# Patient Record
Sex: Female | Born: 1960 | Race: Black or African American | Hispanic: No | Marital: Single | Smoking: Never smoker
Health system: Southern US, Community
[De-identification: ages and names within clinical notes are randomized; demographics above are authoritative.]

## PROBLEM LIST (undated history)

## (undated) HISTORY — PX: ABDOMINAL HYSTERECTOMY: SHX81

---

## 2000-03-18 ENCOUNTER — Ambulatory Visit (HOSPITAL_COMMUNITY): Admission: RE | Admit: 2000-03-18 | Discharge: 2000-03-18 | Payer: Self-pay | Admitting: Obstetrics and Gynecology

## 2012-01-04 ENCOUNTER — Other Ambulatory Visit: Payer: Self-pay | Admitting: Family Medicine

## 2012-01-04 DIAGNOSIS — N6452 Nipple discharge: Secondary | ICD-10-CM

## 2012-01-10 ENCOUNTER — Ambulatory Visit
Admission: RE | Admit: 2012-01-10 | Discharge: 2012-01-10 | Disposition: A | Discharge status: Home / Self Care | Payer: BC Managed Care – PPO | Source: Ambulatory Visit | Attending: Family Medicine | Admitting: Family Medicine

## 2012-01-10 ENCOUNTER — Other Ambulatory Visit: Payer: Self-pay | Admitting: Family Medicine

## 2012-01-10 DIAGNOSIS — N6452 Nipple discharge: Secondary | ICD-10-CM

## 2013-06-13 IMAGING — MG MM DIGITAL DIAGNOSTIC UNILAT*L*
2 series · 2 of 2 positions shown · non-contrast
Comparison: 12/22/2011

CLINICAL DATA: Calcifications, left breast.  Spontaneous left
nipple discharge.

DIGITAL DIAGNOSTIC LEFT MAMMOGRAM WITHOUT CAD

[L CC]
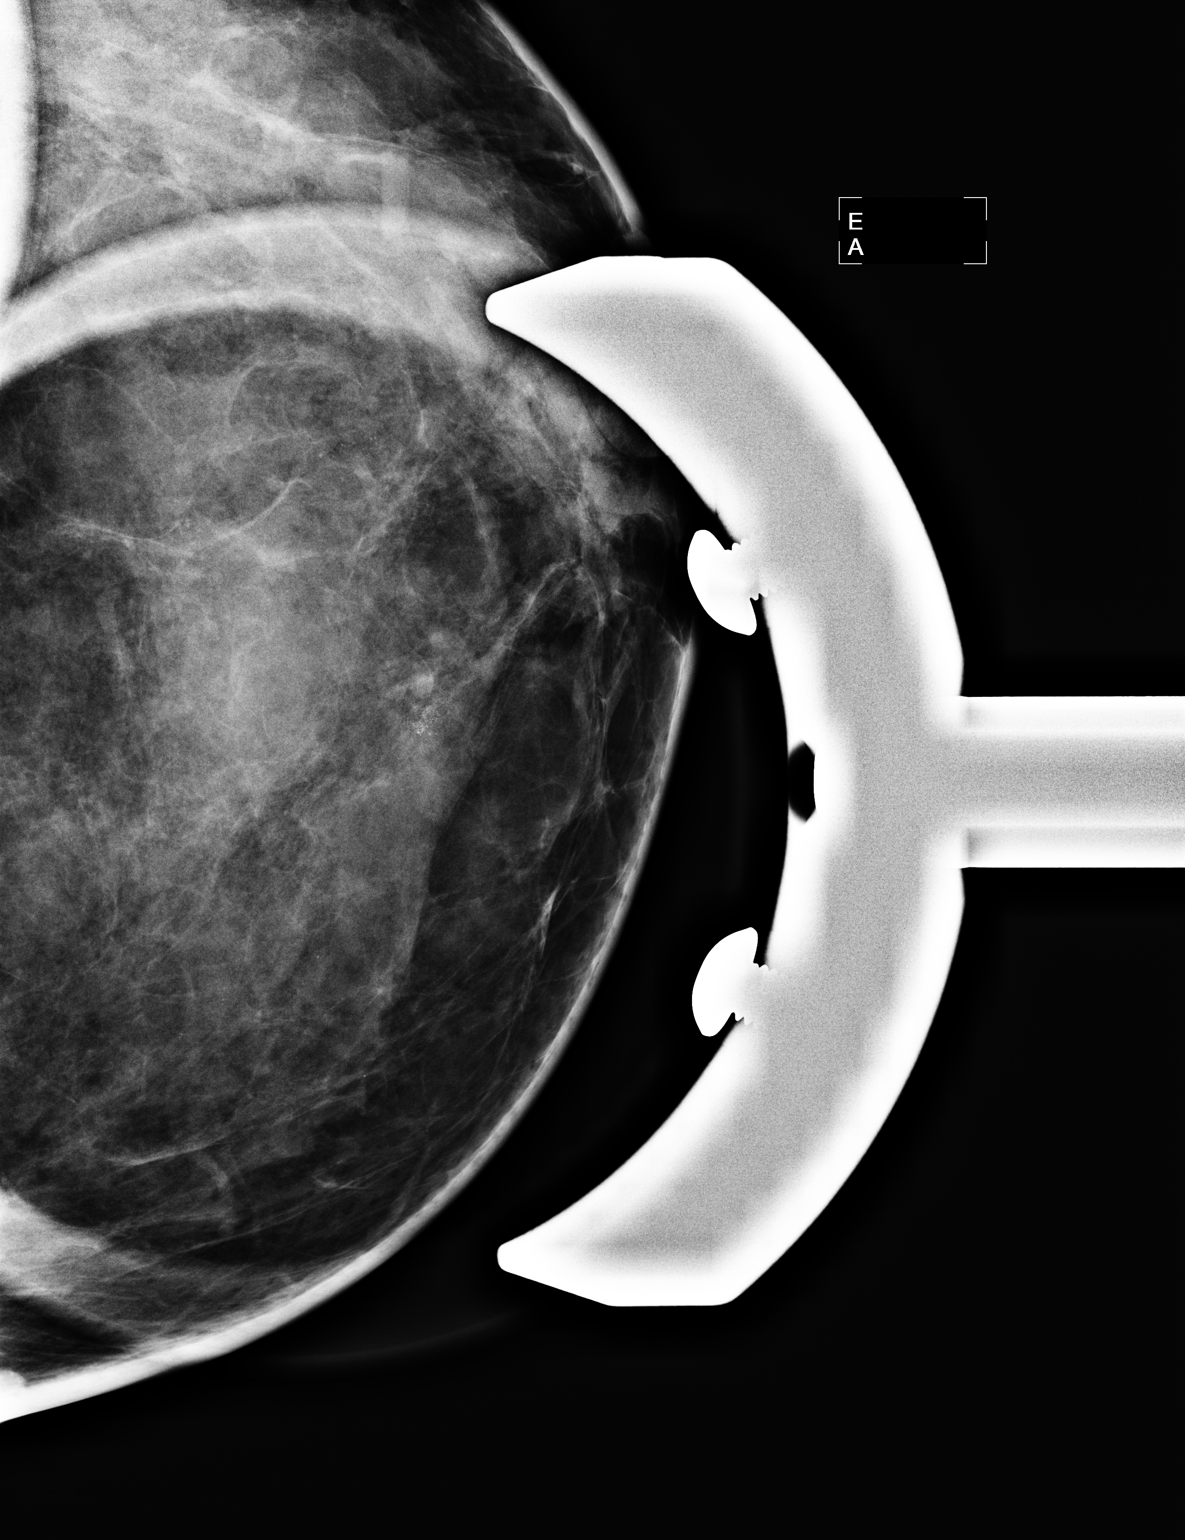

[L ML]
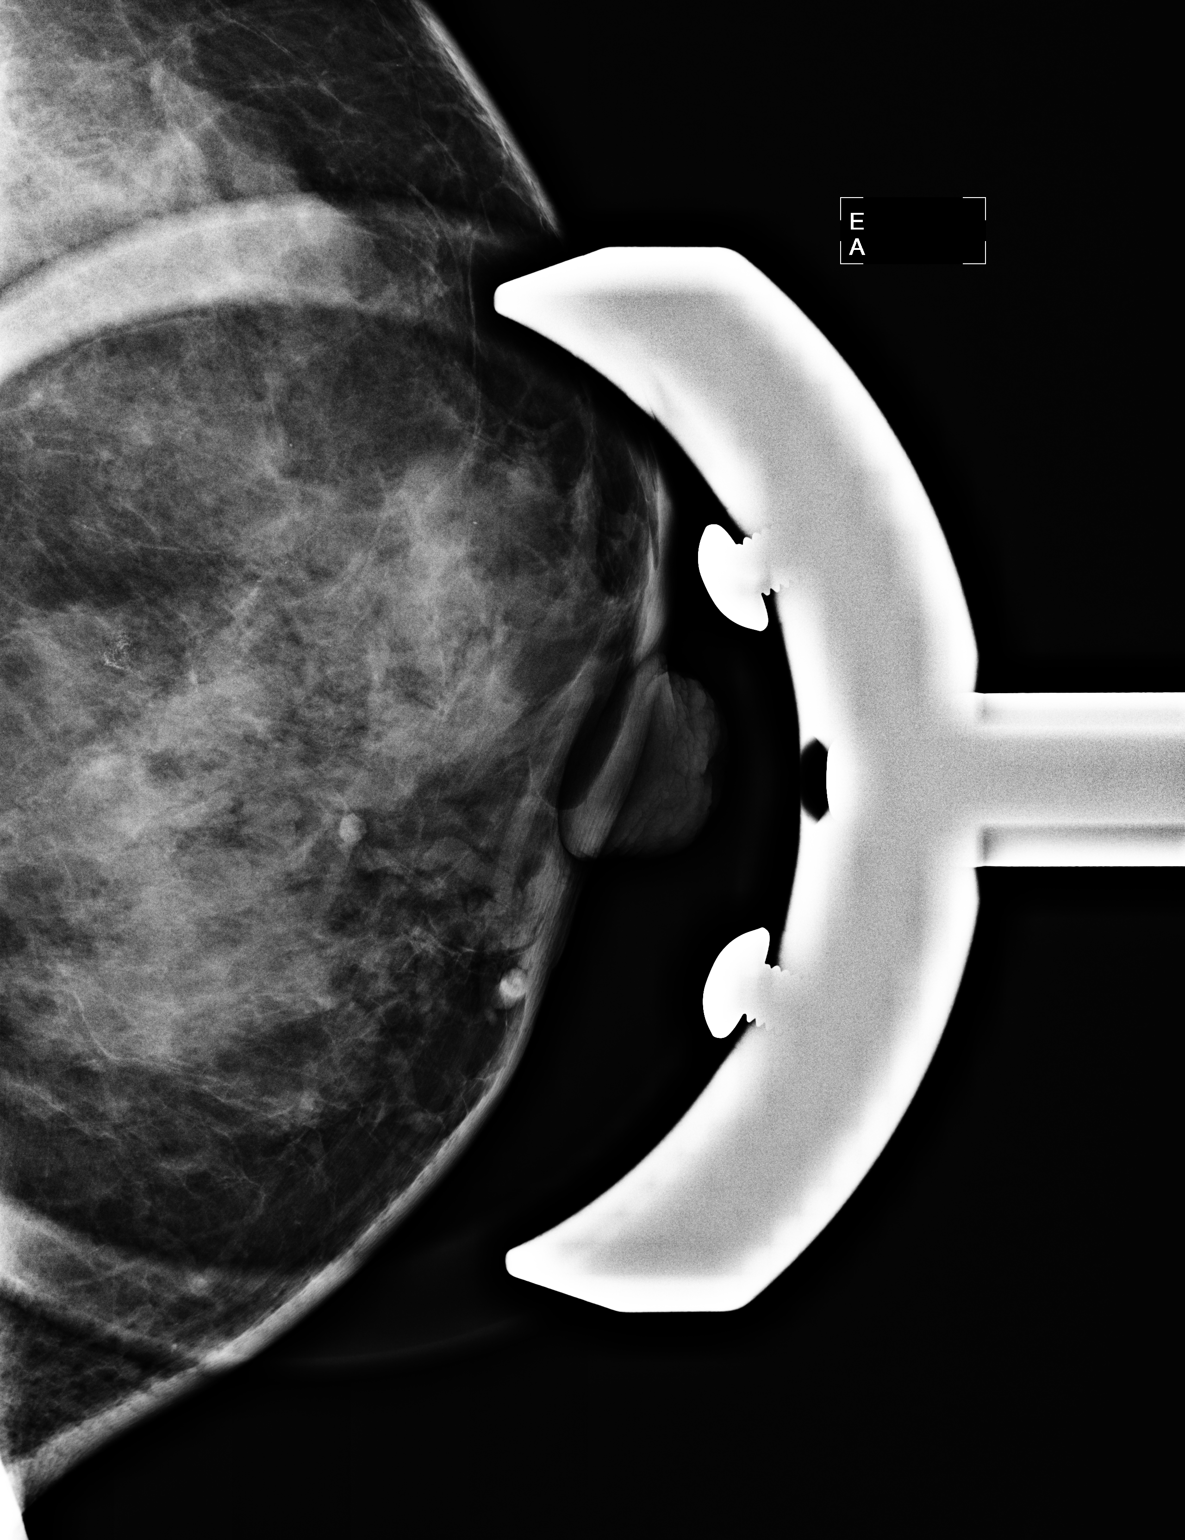

[2 of 2 positions shown; findings below may reference images not displayed]

FINDINGS: Spot magnification views confirm a cluster of
calcifications in the left subareolar region which are amorphous
without linear forms or linear distribution.

On physical exam, clear discharge is elicited from a single duct in
the 3 o'clock position of the left breast. No bloody nipple
discharge is elicited.
IMPRESSION: Probably benign calcifications, left breast. Recommend diagnostic
mammography in 6 months.

BI-RADS CATEGORY 3:  Probably benign finding(s) - short interval
follow-up suggested.

## 2015-06-14 ENCOUNTER — Emergency Department (HOSPITAL_BASED_OUTPATIENT_CLINIC_OR_DEPARTMENT_OTHER): Payer: No Typology Code available for payment source

## 2015-06-14 ENCOUNTER — Emergency Department (HOSPITAL_BASED_OUTPATIENT_CLINIC_OR_DEPARTMENT_OTHER)
Admission: EM | Admit: 2015-06-14 | Discharge: 2015-06-14 | Disposition: A | Discharge status: Home / Self Care | Payer: No Typology Code available for payment source | Attending: Emergency Medicine | Admitting: Emergency Medicine

## 2015-06-14 ENCOUNTER — Encounter (HOSPITAL_BASED_OUTPATIENT_CLINIC_OR_DEPARTMENT_OTHER): Payer: Self-pay | Admitting: Emergency Medicine

## 2015-06-14 DIAGNOSIS — S4992XA Unspecified injury of left shoulder and upper arm, initial encounter: Secondary | ICD-10-CM | POA: Insufficient documentation

## 2015-06-14 DIAGNOSIS — Y9389 Activity, other specified: Secondary | ICD-10-CM | POA: Insufficient documentation

## 2015-06-14 DIAGNOSIS — T07XXXA Unspecified multiple injuries, initial encounter: Secondary | ICD-10-CM

## 2015-06-14 DIAGNOSIS — T148 Other injury of unspecified body region: Secondary | ICD-10-CM | POA: Insufficient documentation

## 2015-06-14 DIAGNOSIS — Y92410 Unspecified street and highway as the place of occurrence of the external cause: Secondary | ICD-10-CM | POA: Insufficient documentation

## 2015-06-14 DIAGNOSIS — S3992XA Unspecified injury of lower back, initial encounter: Secondary | ICD-10-CM | POA: Diagnosis present

## 2015-06-14 DIAGNOSIS — Y998 Other external cause status: Secondary | ICD-10-CM | POA: Diagnosis not present

## 2015-06-14 MED ORDER — IBUPROFEN 800 MG PO TABS: 800.0000 mg | ORAL_TABLET | Freq: Three times a day (TID) | ORAL | Status: AC

## 2015-06-14 MED ORDER — CYCLOBENZAPRINE HCL 10 MG PO TABS: 10.0000 mg | ORAL_TABLET | Freq: Two times a day (BID) | ORAL | Status: AC | PRN
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
## 2015-06-14 NOTE — Discharge Instructions (Signed)
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
